# Patient Record
Sex: Female | Born: 1955 | Race: Black or African American | Hispanic: No | Smoking: Former smoker
Health system: Southern US, Community
[De-identification: ages and names within clinical notes are randomized; demographics above are authoritative.]

---

## 2015-06-22 ENCOUNTER — Inpatient Hospital Stay (HOSPITAL_COMMUNITY)
Admission: EM | Admit: 2015-06-22 | Discharge: 2015-06-24 | DRG: 392 | Disposition: A | Discharge status: Home / Self Care | Payer: Self-pay | Attending: Internal Medicine | Admitting: Internal Medicine

## 2015-06-22 ENCOUNTER — Emergency Department (HOSPITAL_COMMUNITY): Payer: Self-pay

## 2015-06-22 ENCOUNTER — Encounter (HOSPITAL_COMMUNITY): Payer: Self-pay | Admitting: Emergency Medicine

## 2015-06-22 DIAGNOSIS — Z87891 Personal history of nicotine dependence: Secondary | ICD-10-CM

## 2015-06-22 DIAGNOSIS — E876 Hypokalemia: Secondary | ICD-10-CM | POA: Diagnosis present

## 2015-06-22 DIAGNOSIS — Z809 Family history of malignant neoplasm, unspecified: Secondary | ICD-10-CM

## 2015-06-22 DIAGNOSIS — K5792 Diverticulitis of intestine, part unspecified, without perforation or abscess without bleeding: Secondary | ICD-10-CM | POA: Diagnosis present

## 2015-06-22 DIAGNOSIS — Z833 Family history of diabetes mellitus: Secondary | ICD-10-CM

## 2015-06-22 DIAGNOSIS — E669 Obesity, unspecified: Secondary | ICD-10-CM | POA: Diagnosis present

## 2015-06-22 DIAGNOSIS — Z6841 Body Mass Index (BMI) 40.0 and over, adult: Secondary | ICD-10-CM

## 2015-06-22 DIAGNOSIS — K5720 Diverticulitis of large intestine with perforation and abscess without bleeding: Principal | ICD-10-CM

## 2015-06-22 LAB — COMPREHENSIVE METABOLIC PANEL
ALK PHOS: 94 U/L (ref 38–126)
ALT: 22 U/L (ref 14–54)
ANION GAP: 8 (ref 5–15)
AST: 23 U/L (ref 15–41)
Albumin: 2.6 g/dL — ABNORMAL LOW (ref 3.5–5.0)
BILIRUBIN TOTAL: 0.4 mg/dL (ref 0.3–1.2)
BUN: 7 mg/dL (ref 6–20)
CALCIUM: 8.2 mg/dL — AB (ref 8.9–10.3)
CO2: 30 mmol/L (ref 22–32)
CREATININE: 0.82 mg/dL (ref 0.44–1.00)
Chloride: 101 mmol/L (ref 101–111)
GFR calc non Af Amer: >60 mL/min (ref >60)
Glucose, Bld: 144 mg/dL — ABNORMAL HIGH (ref 65–99)
Potassium: 3.3 mmol/L — ABNORMAL LOW (ref 3.5–5.1)
SODIUM: 139 mmol/L (ref 135–145)
TOTAL PROTEIN: 7.6 g/dL (ref 6.5–8.1)

## 2015-06-22 LAB — CBC WITH DIFFERENTIAL/PLATELET
BASOS ABS: 0.2 10*3/uL — AB (ref 0.0–0.1)
BLASTS: 0 %
Band Neutrophils: 0 %
Basophils Relative: 1 %
EOS PCT: 0 %
Eosinophils Absolute: 0 10*3/uL (ref 0.0–0.7)
HEMATOCRIT: 34.9 % — AB (ref 36.0–46.0)
HEMOGLOBIN: 11 g/dL — AB (ref 12.0–15.0)
Lymphocytes Relative: 16 %
Lymphs Abs: 3.1 10*3/uL (ref 0.7–4.0)
MCH: 26.7 pg (ref 26.0–34.0)
MCHC: 31.5 g/dL (ref 30.0–36.0)
MCV: 84.7 fL (ref 78.0–100.0)
METAMYELOCYTES PCT: 0 %
MYELOCYTES: 0 %
Monocytes Absolute: 1 10*3/uL (ref 0.1–1.0)
Monocytes Relative: 5 %
NEUTROS PCT: 78 %
NRBC: 0 /100{WBCs}
Neutro Abs: 14.9 10*3/uL — ABNORMAL HIGH (ref 1.7–7.7)
Other: 0 %
PROMYELOCYTES ABS: 0 %
Platelets: 458 10*3/uL — ABNORMAL HIGH (ref 150–400)
RBC: 4.12 MIL/uL (ref 3.87–5.11)
RDW: 14.7 % (ref 11.5–15.5)
WBC: 19.2 10*3/uL — AB (ref 4.0–10.5)

## 2015-06-22 LAB — URINALYSIS, ROUTINE W REFLEX MICROSCOPIC
Glucose, UA: NEGATIVE mg/dL
HGB URINE DIPSTICK: NEGATIVE
KETONES UR: NEGATIVE mg/dL
NITRITE: NEGATIVE
PH: 6.5 (ref 5.0–8.0)
SPECIFIC GRAVITY, URINE: 1.01 (ref 1.005–1.030)
UROBILINOGEN UA: 1 mg/dL (ref 0.0–1.0)

## 2015-06-22 LAB — URINE MICROSCOPIC-ADD ON

## 2015-06-22 MED ORDER — DEXTROSE-NACL 5-0.9 % IV SOLN
INTRAVENOUS | Status: DC
  Administered 2015-06-22 – 2015-06-23 (×2): via INTRAVENOUS

## 2015-06-22 MED ORDER — HYDROMORPHONE HCL 1 MG/ML IJ SOLN
1.0000 mg | INTRAMUSCULAR | Status: DC | PRN
  Administered 2015-06-23 (×2): 1 mg via INTRAVENOUS
  Filled 2015-06-22 (×2): qty 1

## 2015-06-22 MED ORDER — METRONIDAZOLE IN NACL 5-0.79 MG/ML-% IV SOLN
500.0000 mg | Freq: Once | INTRAVENOUS | Status: AC
  Administered 2015-06-22: 500 mg via INTRAVENOUS
  Filled 2015-06-22: qty 100

## 2015-06-22 MED ORDER — CIPROFLOXACIN IN D5W 400 MG/200ML IV SOLN
400.0000 mg | Freq: Two times a day (BID) | INTRAVENOUS | Status: DC
  Administered 2015-06-22 – 2015-06-23 (×3): 400 mg via INTRAVENOUS
  Filled 2015-06-22 (×3): qty 200

## 2015-06-22 MED ORDER — HEPARIN SODIUM (PORCINE) 5000 UNIT/ML IJ SOLN
5000.0000 [IU] | Freq: Three times a day (TID) | INTRAMUSCULAR | Status: DC
Start: 2015-06-22 — End: 2015-06-24
  Administered 2015-06-23 – 2015-06-24 (×3): 5000 [IU] via SUBCUTANEOUS
  Filled 2015-06-22 (×3): qty 1

## 2015-06-22 MED ORDER — ONDANSETRON HCL 4 MG PO TABS: 4.0000 mg | ORAL_TABLET | Freq: Four times a day (QID) | ORAL | Status: DC | PRN

## 2015-06-22 MED ORDER — METRONIDAZOLE IN NACL 5-0.79 MG/ML-% IV SOLN
500.0000 mg | Freq: Three times a day (TID) | INTRAVENOUS | Status: DC
Start: 2015-06-23 — End: 2015-06-24
  Administered 2015-06-23 – 2015-06-24 (×4): 500 mg via INTRAVENOUS
  Filled 2015-06-22 (×4): qty 100

## 2015-06-22 MED ORDER — CIPROFLOXACIN IN D5W 400 MG/200ML IV SOLN
400.0000 mg | Freq: Once | INTRAVENOUS | Status: AC
  Filled 2015-06-22: qty 200

## 2015-06-22 MED ORDER — ONDANSETRON HCL 4 MG/2ML IJ SOLN: 4.0000 mg | Freq: Four times a day (QID) | INTRAMUSCULAR | Status: DC | PRN

## 2015-06-22 MED ORDER — SODIUM CHLORIDE 0.9 % IV BOLUS (SEPSIS)
1000.0000 mL | Freq: Once | INTRAVENOUS | Status: AC
Start: 2015-06-22 — End: 2015-06-22
  Administered 2015-06-22: 1000 mL via INTRAVENOUS

## 2015-06-22 MED ORDER — IOHEXOL 300 MG/ML  SOLN
25.0000 mL | Freq: Once | INTRAMUSCULAR | Status: AC | PRN
  Administered 2015-06-22: 25 mL via ORAL

## 2015-06-22 MED ORDER — IOHEXOL 300 MG/ML  SOLN
100.0000 mL | Freq: Once | INTRAMUSCULAR | Status: AC | PRN
  Administered 2015-06-22: 100 mL via INTRAVENOUS

## 2015-06-22 MED ORDER — DOCUSATE SODIUM 100 MG PO CAPS
100.0000 mg | ORAL_CAPSULE | Freq: Two times a day (BID) | ORAL | Status: DC
Start: 2015-06-22 — End: 2015-06-24
  Administered 2015-06-23 – 2015-06-24 (×2): 100 mg via ORAL
  Filled 2015-06-22 (×3): qty 1

## 2015-06-22 NOTE — ED Provider Notes (Addendum)
CSN: [id number]     Arrival date & time 06/22/15  1819 History   This chart was scribed for Courteney Randall An, MD by Arlan Organ, ED Scribe. This patient was seen in room APA04/APA04 and the patient's care was started 8:29 PM.   Chief Complaint  Patient presents with  . Abdominal Pain   The history is provided by the patient. No language interpreter was used.     HPI Comments: Kelly Mayer is a 59 y.o. female without any pertinent past medical history who presents to the Emergency Department complaining of intermittent, ongoing RLQ abdominal pain x 3 weeks; worsened this evening. Pain is described as sharp. Discomfort is made worse with ambulation and with all movement. No alleviating factors at this time. No OTC medications or home remedies attempted prior to arrival. Denies any fever, chills, nausea, vomiting, or diarrhea. No reported urinary symptoms. No known allergies to medications.  Of note: Pt known to be febrile in triage this evening   History reviewed. No pertinent past medical history. History reviewed. No pertinent past surgical history. Family History  Problem Relation Age of Onset  . Diabetes Sister   . Cancer Brother    Social History  Substance Use Topics  . Smoking status: Former Smoker -- 0.50 packs/day for 15 years    Types: Cigarettes    Quit date: 10/04/2004  . Smokeless tobacco: Never Used  . Alcohol Use: No   OB History    Gravida Para Term Preterm AB TAB SAB Ectopic Multiple Living   Review of Systems  Constitutional: Negative for fever and chills.  Respiratory: Negative for shortness of breath.   Gastrointestinal: Positive for abdominal pain. Negative for nausea, vomiting and diarrhea.  Genitourinary: Negative for dysuria, urgency, frequency and hematuria.  Psychiatric/Behavioral: Negative for confusion.  All other systems reviewed and are negative.     Allergies  Review of patient's allergies indicates no known  allergies.  Home Medications   Prior to Admission medications   Not on File   Triage Vitals: BP 155/94 mmHg  Pulse 105  Temp(Src) 101.3 F (38.5 C) (Oral)  Resp 20  Ht  (1.676 m)  Wt 310 lb (140.615 kg)  BMI 50.06 kg/m2  SpO2 100%   Physical Exam  Constitutional: She is oriented to person, place, and time. She appears well-developed and well-nourished. No distress.  Morbidly obese african [place] female   HENT:  Head: Normocephalic and atraumatic.  Eyes: EOM are normal.  Neck: Normal range of motion.  Cardiovascular: Normal rate, regular rhythm and normal heart sounds.   Pulmonary/Chest: Effort normal and breath sounds normal. She has no wheezes.  Lungs clear  Abdominal: Soft. She exhibits no distension. There is tenderness.  Tenderness to palpation under pannus on the RLQ  Musculoskeletal: Normal range of motion.  Neurological: She is alert and oriented to person, place, and time.  Skin: Skin is warm and dry.  Psychiatric: She has a normal mood and affect. Judgment normal.  Nursing note and vitals reviewed.   ED Course  Procedures (including critical care time)  DIAGNOSTIC STUDIES: Oxygen Saturation is 100% on RA, Normal by my interpretation.    COORDINATION OF CARE: 8:34 PM- Will order CT abdomen pelvis with contrast, CBC, CMP, and urinalysis. Discussed treatment plan with pt at bedside and pt agreed to plan.     Labs Review Labs Reviewed  CBC WITH DIFFERENTIAL/PLATELET - Abnormal; Notable  for the following:    WBC 19.2 (*)    Hemoglobin 11.0 (*)    HCT 34.9 (*)    Platelets 458 (*)    Neutro Abs 14.9 (*)    Basophils Absolute 0.2 (*)    All other components within normal limits  COMPREHENSIVE METABOLIC PANEL - Abnormal; Notable for the following:    Potassium 3.3 (*)    Glucose, Bld 144 (*)    Calcium 8.2 (*)    Albumin 2.6 (*)    All other components within normal limits  URINALYSIS, ROUTINE W REFLEX MICROSCOPIC (NOT AT [facility]) - Abnormal; Notable  for the following:    APPearance HAZY (*)    Bilirubin Urine SMALL (*)    Protein, ur TRACE (*)    Leukocytes, UA TRACE (*)    All other components within normal limits  URINE MICROSCOPIC-ADD ON - Abnormal; Notable for the following:    Squamous Epithelial / LPF MANY (*)    Bacteria, UA MANY (*)    All other components within normal limits    Imaging Review Ct Abdomen Pelvis W Contrast  06/22/2015   CLINICAL DATA:  Constant right lower abdominal pain with generalize weakness for 3 weeks. Fever. Nausea. Decreased appetite. Vomiting bowel. Diarrhea.  EXAM: CT ABDOMEN AND PELVIS WITH CONTRAST  TECHNIQUE: Multidetector CT imaging of the abdomen and pelvis was performed using the standard protocol following bolus administration of intravenous contrast.  CONTRAST:  25mL OMNIPAQUE IOHEXOL 300 MG/ML SOLN, OMNIPAQUE IOHEXOL 300 MG/ML SOLN  COMPARISON:  None.  FINDINGS: Lung bases are clear.  Mild diffuse fatty infiltration of the liver. Cholelithiasis without inflammatory changes. No bile duct dilatation. Pancreas, spleen, adrenal glands, kidneys, abdominal aorta, inferior vena cava, and retroperitoneal lymph nodes are unremarkable. Stomach, small bowel, and colon are not abnormally distended. No free air or free fluid in the abdomen.  Pelvis: The appendix extends up to the anterior mid abdomen and is normal in appearance. The appendix is normal in caliber and is diffusely gas-filled. There is inflammatory reaction in the right pelvis with inflammatory stranding in the right pelvic fat and tiny free gas bubbles trapped in the fat. This appears to be related to the sigmoid colon and likely represents diverticulitis. Pelvic inflammatory disease less likely with the presence of gas. There is a loculated fluid collection in the anterior right pelvis measuring 3.6 cm diameter. I believe this to represent an ovarian cyst rather than abscess associated with the diverticulitis. Diverticulosis of the sigmoid and  descending colon. Uterus is not enlarged. Bladder wall is not thickened. Moderate-sized periumbilical hernia containing fat. Degenerative changes in the spine. No destructive bone lesions.  IMPRESSION: Inflammatory changes in the right pelvis likely representing diverticulitis with tiny gas bubbles consistent with localized micro perforation. No discrete abscess. Probable right renal cyst. Appendix is normal.  These results were called by telephone at the time of interpretation on 06/22/2015 at 9:39 pm to Dr. Bary Castilla , who verbally acknowledged these results.   Electronically Signed   By: Burman Nieves M.D.   On: 06/22/2015 21:46   I have personally reviewed and evaluated these images and lab results as part of my medical decision-making.   EKG Interpretation None      MDM   Final diagnoses:  None    Patient is a 59 year old female, African-American obese female presenting with right lower quadrant pain. This is been going on for 3 weeks off and on. She's had mild nausea. She had blood  drawn at triage and noted to have an white count as well as tachycardia. Location her pain is in the right lower quadrant. We will get an CAT scan to rule out appendicitis. Although this be atypical for appendicitis given her age and length of symptoms.  No other symptoms such as urinary or vaginal symptoms.  10:13 PM Patient CAT scan shows diverticulitis with microperfs. This is considered,complicated diverticulosis we'll admit to hospital. IV antibiotics ordered.    I personally performed the services described in this documentation, which was scribed in my presence. The recorded information has been reviewed and is accurate.   Courteney Randall An, MD 06/22/15 2213  Courteney Randall An, MD 06/22/15 2213

## 2015-06-22 NOTE — ED Notes (Signed)
Patient c/o right lower abd pain with generalized weakness x3 weeks. Unsure of any fevers. Fever noted in triage. Patient reports nausea, decrease of appetite, vomiting bile x1, and diarrhea. Per patient craving ice. Denies any blood in stool.

## 2015-06-22 NOTE — H&P (Signed)
[facility] History and Physical  Kelly Mayer ZOX:[id number] DOB: 1956/02/23    PCP:  No PCP.   Chief Complaint: abdominal pain for at least 3 weeks.   HPI: Kelly Mayer is an 59 y.o. female with benign past medical history on no medication, but clearly by virtue of hardly ever saw a physician, as her last visit to any physician was over 10 years ago, presented to the ER with LLQ pain for several weeks.  She has no fever, chills, nausea, vomiting, black or bloody stool.  She never had any colonoscopy before.  Work up included a leukocytosis with WBC of 19K, and an abdominal pelvic CT showed diverticulitis with "microperforation" and no abscess.  She was given IV Cipro and Flagyl, and hospitalist was asked to admit her for complicated diverticultis.   Rewiew of Systems:  Constitutional: Negative for malaise, fever and chills. No significant weight loss or weight gain Eyes: Negative for eye pain, redness and discharge, diplopia, visual changes, or flashes of light. ENMT: Negative for ear pain, hoarseness, nasal congestion, sinus pressure and sore throat. No headaches; tinnitus, drooling, or problem swallowing. Cardiovascular: Negative for chest pain, palpitations, diaphoresis, dyspnea and peripheral edema. ; No orthopnea, PND Respiratory: Negative for cough, hemoptysis, wheezing and stridor. No pleuritic chestpain. Gastrointestinal: Negative for nausea, vomiting, diarrhea, constipation, , melena, blood in stool, hematemesis, jaundice and rectal bleeding.    Genitourinary: Negative for frequency, dysuria, incontinence,flank pain and hematuria; Musculoskeletal: Negative for back pain and neck pain. Negative for swelling and trauma.;  Skin: . Negative for pruritus, rash, abrasions, bruising and skin lesion.; ulcerations Neuro: Negative for headache, lightheadedness and neck stiffness. Negative for weakness, altered level of consciousness , altered mental status, extremity weakness,  burning feet, involuntary movement, seizure and syncope.  Psych: negative for anxiety, depression, insomnia, tearfulness, panic attacks, hallucinations, paranoia, suicidal or homicidal ideation    History reviewed. No pertinent past medical history.  History reviewed. No pertinent past surgical history.  Medications:  HOME MEDS: On mo meds.         Allergies:  No Known Allergies  Social History:   reports that she quit smoking about 10 years ago. Her smoking use included Cigarettes. She has a 7.5 pack-year smoking history. She has never used smokeless tobacco. She reports that she does not drink alcohol or use illicit drugs.  Family History: Family History  Problem Relation Age of Onset  . Diabetes Sister   . Cancer Brother      Physical Exam: Filed Vitals:   06/22/15 1835 06/22/15 2230  BP: 155/94 145/66  Pulse: 105 94  Temp: 101.3 F (38.5 C) 99.5 F (37.5 C)  TempSrc: Oral Oral  Resp: 20 20  Height:  (1.676 m)   Weight: 140.615 kg (310 lb)   SpO2: 100% 100%   Blood pressure 145/66, pulse 94, temperature 99.5 F (37.5 C), temperature source Oral, resp. rate 20, height  (1.676 m), weight 140.615 kg (310 lb), SpO2 100 %.  GEN:  Pleasant  patient lying in the stretcher in no acute distress; cooperative with exam. She is obese.  PSYCH:  alert and oriented x4; does not appear anxious or depressed; affect is appropriate. HEENT: Mucous membranes pink and anicteric; PERRLA; EOM intact; no cervical lymphadenopathy nor thyromegaly or carotid bruit; no JVD; There were no stridor. Neck is very supple. Breasts:: Not examined CHEST WALL: No tenderness CHEST: Normal respiration, clear to auscultation bilaterally.  HEART: Regular rate and rhythm.  There are no murmur,  rub, or gallops.   BACK: No kyphosis or scoliosis; no CVA tenderness ABDOMEN: soft and non-tender; no masses, no organomegaly, normal abdominal bowel sounds; no pannus; no intertriginous candida. There  is left lower quadrant tenderness, but no rebound.  Rectal Exam: Not done EXTREMITIES: No bone or joint deformity; age-appropriate arthropathy of the hands and knees; no edema; no ulcerations.  There is no calf tenderness. Genitalia: not examined PULSES: 2+ and symmetric SKIN: Normal hydration no rash or ulceration CNS: Cranial nerves 2-12 grossly intact no focal lateralizing neurologic deficit.  Speech is fluent; uvula elevated with phonation, facial symmetry and tongue midline. DTR are normal bilaterally, cerebella exam is intact, barbinski is negative and strengths are equaled bilaterally.  No sensory loss.   Labs on Admission:  Basic Metabolic Panel:  Recent Labs Lab 06/22/15 1933  NA 139  K 3.3*  CL 101  CO2 30  GLUCOSE 144*  BUN 7  CREATININE 0.82  CALCIUM 8.2*   Liver Function Tests:  Recent Labs Lab 06/22/15 1933  AST 23  ALT 22  ALKPHOS 94  BILITOT 0.4  PROT 7.6  ALBUMIN 2.6*   CBC:  Recent Labs Lab 06/22/15 1933  WBC 19.2*  NEUTROABS 14.9*  HGB 11.0*  HCT 34.9*  MCV 84.7  PLT 458*   Radiological Exams on Admission: Ct Abdomen Pelvis W Contrast  06/22/2015   CLINICAL DATA:  Constant right lower abdominal pain with generalize weakness for 3 weeks. Fever. Nausea. Decreased appetite. Vomiting bowel. Diarrhea.  EXAM: CT ABDOMEN AND PELVIS WITH CONTRAST  TECHNIQUE: Multidetector CT imaging of the abdomen and pelvis was performed using the standard protocol following bolus administration of intravenous contrast.  CONTRAST:  25mL OMNIPAQUE IOHEXOL 300 MG/ML SOLN, OMNIPAQUE IOHEXOL 300 MG/ML SOLN  COMPARISON:  None.  FINDINGS: Lung bases are clear.  Mild diffuse fatty infiltration of the liver. Cholelithiasis without inflammatory changes. No bile duct dilatation. Pancreas, spleen, adrenal glands, kidneys, abdominal aorta, inferior vena cava, and retroperitoneal lymph nodes are unremarkable. Stomach, small bowel, and colon are not abnormally distended. No free  air or free fluid in the abdomen.  Pelvis: The appendix extends up to the anterior mid abdomen and is normal in appearance. The appendix is normal in caliber and is diffusely gas-filled. There is inflammatory reaction in the right pelvis with inflammatory stranding in the right pelvic fat and tiny free gas bubbles trapped in the fat. This appears to be related to the sigmoid colon and likely represents diverticulitis. Pelvic inflammatory disease less likely with the presence of gas. There is a loculated fluid collection in the anterior right pelvis measuring 3.6 cm diameter. I believe this to represent an ovarian cyst rather than abscess associated with the diverticulitis. Diverticulosis of the sigmoid and descending colon. Uterus is not enlarged. Bladder wall is not thickened. Moderate-sized periumbilical hernia containing fat. Degenerative changes in the spine. No destructive bone lesions.  IMPRESSION: Inflammatory changes in the right pelvis likely representing diverticulitis with tiny gas bubbles consistent with localized micro perforation. No discrete abscess. Probable right renal cyst. Appendix is normal.  These results were called by telephone at the time of interpretation on 06/22/2015 at 9:39 pm to Dr. Bary Castilla , who verbally acknowledged these results.   Electronically Signed   By: Burman Nieves M.D.   On: 06/22/2015 21:46    EKG: Independently reviewed.   Assessment/Plan  . Diverticulitis . Obesity  PLAN:  Will admit her for diverticulitis with microperforation.  Will continue with IV Cipro  and IV flagyl.  Will give IV pain medication, clear liquid, and IVF.  She will need a colonoscopy at some point, as colon cancer needs to be excluded.  I will consult surgery for any further recommendation.  She will need to find a PCP for follow up as well.  Thank you for allowing me to participate in her care.   Other plans as per orders.  Code Status: FULL Unk Lightning, MD. [facility] Pager [phone] 7pm to 7am.  06/22/2015, 11:05 PM

## 2015-06-23 ENCOUNTER — Encounter (HOSPITAL_COMMUNITY): Payer: Self-pay | Admitting: *Deleted

## 2015-06-23 DIAGNOSIS — K5720 Diverticulitis of large intestine with perforation and abscess without bleeding: Secondary | ICD-10-CM

## 2015-06-23 LAB — CBC
HCT: 30.9 % — ABNORMAL LOW (ref 36.0–46.0)
Hemoglobin: 9.5 g/dL — ABNORMAL LOW (ref 12.0–15.0)
MCH: 26.2 pg (ref 26.0–34.0)
MCHC: 30.7 g/dL (ref 30.0–36.0)
MCV: 85.1 fL (ref 78.0–100.0)
PLATELETS: 417 10*3/uL — AB (ref 150–400)
RBC: 3.63 MIL/uL — ABNORMAL LOW (ref 3.87–5.11)
RDW: 14.9 % (ref 11.5–15.5)
WBC: 14.3 10*3/uL — AB (ref 4.0–10.5)

## 2015-06-23 LAB — BASIC METABOLIC PANEL
Anion gap: 6 (ref 5–15)
BUN: 6 mg/dL (ref 6–20)
CHLORIDE: 103 mmol/L (ref 101–111)
CO2: 27 mmol/L (ref 22–32)
CREATININE: 0.76 mg/dL (ref 0.44–1.00)
Calcium: 7.5 mg/dL — ABNORMAL LOW (ref 8.9–10.3)
GFR calc Af Amer: >60 mL/min (ref >60)
Glucose, Bld: 198 mg/dL — ABNORMAL HIGH (ref 65–99)
Potassium: 3.1 mmol/L — ABNORMAL LOW (ref 3.5–5.1)
SODIUM: 136 mmol/L (ref 135–145)

## 2015-06-23 MED ORDER — HYDROCODONE-ACETAMINOPHEN 5-325 MG PO TABS
1.0000 | ORAL_TABLET | Freq: Four times a day (QID) | ORAL | Status: DC | PRN
  Administered 2015-06-23: 1 via ORAL
  Filled 2015-06-23: qty 1

## 2015-06-23 MED ORDER — POTASSIUM CHLORIDE 10 MEQ/100ML IV SOLN
10.0000 meq | INTRAVENOUS | Status: DC
  Administered 2015-06-23 (×3): 10 meq via INTRAVENOUS
  Filled 2015-06-23: qty 100

## 2015-06-23 MED ORDER — KCL IN DEXTROSE-NACL 40-5-0.45 MEQ/L-%-% IV SOLN
INTRAVENOUS | Status: DC
  Administered 2015-06-23: 11:00:00 via INTRAVENOUS

## 2015-06-23 NOTE — Consult Note (Signed)
Reason for Consult: Sigmoid diverticulitis Referring Physician: Hospitalist  Declyn Offield is an 59 y.o. female.  HPI: Patient is a 59 year old black female who states for the past 3 weeks she has been having lower abdominal pain. She presented the emergency patient room due to worsening pain. CT scan the abdomen and pelvis reveals diverticulitis with microperforation. She also has a large ovarian cyst. No free fluid is noted within the abdominal cavity. No significant pneumoperitoneum is noted. She states this is her first episode. She has not seen a doctor in over 10 years. She is having bowel movements.  History reviewed. No pertinent past medical history.  History reviewed. No pertinent past surgical history.  Family History  Problem Relation Age of Onset  . Diabetes Sister   . Cancer Brother     Social History:  reports that she quit smoking about 10 years ago. Her smoking use included Cigarettes. She has a 7.5 pack-year smoking history. She has never used smokeless tobacco. She reports that she does not drink alcohol or use illicit drugs.  Allergies: No Known Allergies  Medications: I have reviewed the patient's current medications.  Results for orders placed or performed during the hospital encounter of 06/22/15 (from the past 48 hour(s))  CBC with Differential     Status: Abnormal   Collection Time: 06/22/15  7:33 PM  Result Value Ref Range   WBC 19.2 (H) 4.0 - 10.5 K/uL   RBC 4.12 3.87 - 5.11 MIL/uL   Hemoglobin 11.0 (L) 12.0 - 15.0 g/dL   HCT 34.9 (L) 36.0 - 46.0 %   MCV 84.7 78.0 - 100.0 fL   MCH 26.7 26.0 - 34.0 pg   MCHC 31.5 30.0 - 36.0 g/dL   RDW 14.7 11.5 - 15.5 %   Platelets 458 (H) 150 - 400 K/uL   Neutrophils Relative % 78 %   Lymphocytes Relative 16 %   Monocytes Relative 5 %   Eosinophils Relative 0 %   Basophils Relative 1 %   Band Neutrophils 0 %   Metamyelocytes Relative 0 %   Myelocytes 0 %   Promyelocytes Absolute 0 %   Blasts 0 %   nRBC 0 0  /100 WBC   Other 0 %   Neutro Abs 14.9 (H) 1.7 - 7.7 K/uL   Lymphs Abs 3.1 0.7 - 4.0 K/uL   Monocytes Absolute 1.0 0.1 - 1.0 K/uL   Eosinophils Absolute 0.0 0.0 - 0.7 K/uL   Basophils Absolute 0.2 (H) 0.0 - 0.1 K/uL  Comprehensive metabolic panel     Status: Abnormal   Collection Time: 06/22/15  7:33 PM  Result Value Ref Range   Sodium 139 135 - 145 mmol/L   Potassium 3.3 (L) 3.5 - 5.1 mmol/L   Chloride 101 101 - 111 mmol/L   CO2 30 22 - 32 mmol/L   Glucose, Bld 144 (H) 65 - 99 mg/dL   BUN 7 6 - 20 mg/dL   Creatinine, Ser 0.82 0.44 - 1.00 mg/dL   Calcium 8.2 (L) 8.9 - 10.3 mg/dL   Total Protein 7.6 6.5 - 8.1 g/dL   Albumin 2.6 (L) 3.5 - 5.0 g/dL   AST 23 15 - 41 U/L   ALT 22 14 - 54 U/L   Alkaline Phosphatase 94 38 - 126 U/L   Total Bilirubin 0.4 0.3 - 1.2 mg/dL   GFR calc non Af Amer >60 >60 mL/min   GFR calc Af Amer >60 >60 mL/min    Comment: (NOTE) The  eGFR has been calculated using the CKD EPI equation. This calculation has not been validated in all clinical situations. eGFR's persistently <60 mL/min signify possible Chronic Kidney Disease.    Anion gap 8 5 - 15  Urinalysis, Routine w reflex microscopic (not at [facility])     Status: Abnormal   Collection Time: 06/22/15  7:50 PM  Result Value Ref Range   Color, Urine YELLOW YELLOW   APPearance HAZY (A) CLEAR   Specific Gravity, Urine 1.010 1.005 - 1.030   pH 6.5 5.0 - 8.0   Glucose, UA NEGATIVE NEGATIVE mg/dL   Hgb urine dipstick NEGATIVE NEGATIVE   Bilirubin Urine SMALL (A) NEGATIVE   Ketones, ur NEGATIVE NEGATIVE mg/dL   Protein, ur TRACE (A) NEGATIVE mg/dL   Urobilinogen, UA 1.0 0.0 - 1.0 mg/dL   Nitrite NEGATIVE NEGATIVE   Leukocytes, UA TRACE (A) NEGATIVE  Urine microscopic-add on     Status: Abnormal   Collection Time: 06/22/15  7:50 PM  Result Value Ref Range   Squamous Epithelial / LPF MANY (A) RARE   WBC, UA 3-6 <3 WBC/hpf   RBC / HPF 0-2 <3 RBC/hpf   Bacteria, UA MANY (A) RARE  Basic metabolic panel      Status: Abnormal   Collection Time: 06/23/15  7:43 AM  Result Value Ref Range   Sodium 136 135 - 145 mmol/L   Potassium 3.1 (L) 3.5 - 5.1 mmol/L   Chloride 103 101 - 111 mmol/L   CO2 27 22 - 32 mmol/L   Glucose, Bld 198 (H) 65 - 99 mg/dL   BUN 6 6 - 20 mg/dL   Creatinine, Ser 0.76 0.44 - 1.00 mg/dL   Calcium 7.5 (L) 8.9 - 10.3 mg/dL   GFR calc non Af Amer >60 >60 mL/min   GFR calc Af Amer >60 >60 mL/min    Comment: (NOTE) The eGFR has been calculated using the CKD EPI equation. This calculation has not been validated in all clinical situations. eGFR's persistently <60 mL/min signify possible Chronic Kidney Disease.    Anion gap 6 5 - 15  CBC     Status: Abnormal   Collection Time: 06/23/15  7:43 AM  Result Value Ref Range   WBC 14.3 (H) 4.0 - 10.5 K/uL   RBC 3.63 (L) 3.87 - 5.11 MIL/uL   Hemoglobin 9.5 (L) 12.0 - 15.0 g/dL   HCT 30.9 (L) 36.0 - 46.0 %   MCV 85.1 78.0 - 100.0 fL   MCH 26.2 26.0 - 34.0 pg   MCHC 30.7 30.0 - 36.0 g/dL   RDW 14.9 11.5 - 15.5 %   Platelets 417 (H) 150 - 400 K/uL    Ct Abdomen Pelvis W Contrast  06/22/2015   CLINICAL DATA:  Constant right lower abdominal pain with generalize weakness for 3 weeks. Fever. Nausea. Decreased appetite. Vomiting bowel. Diarrhea.  EXAM: CT ABDOMEN AND PELVIS WITH CONTRAST  TECHNIQUE: Multidetector CT imaging of the abdomen and pelvis was performed using the standard protocol following bolus administration of intravenous contrast.  CONTRAST:  4m OMNIPAQUE IOHEXOL 300 MG/ML SOLN, 1054mOMNIPAQUE IOHEXOL 300 MG/ML SOLN  COMPARISON:  None.  FINDINGS: Lung bases are clear.  Mild diffuse fatty infiltration of the liver. Cholelithiasis without inflammatory changes. No bile duct dilatation. Pancreas, spleen, adrenal glands, kidneys, abdominal aorta, inferior vena cava, and retroperitoneal lymph nodes are unremarkable. Stomach, small bowel, and colon are not abnormally distended. No free air or free fluid in the abdomen.  Pelvis: The  appendix  extends up to the anterior mid abdomen and is normal in appearance. The appendix is normal in caliber and is diffusely gas-filled. There is inflammatory reaction in the right pelvis with inflammatory stranding in the right pelvic fat and tiny free gas bubbles trapped in the fat. This appears to be related to the sigmoid colon and likely represents diverticulitis. Pelvic inflammatory disease less likely with the presence of gas. There is a loculated fluid collection in the anterior right pelvis measuring 3.6 cm diameter. I believe this to represent an ovarian cyst rather than abscess associated with the diverticulitis. Diverticulosis of the sigmoid and descending colon. Uterus is not enlarged. Bladder wall is not thickened. Moderate-sized periumbilical hernia containing fat. Degenerative changes in the spine. No destructive bone lesions.  IMPRESSION: Inflammatory changes in the right pelvis likely representing diverticulitis with tiny gas bubbles consistent with localized micro perforation. No discrete abscess. Probable right renal cyst. Appendix is normal.  These results were called by telephone at the time of interpretation on 06/22/2015 at 9:39 pm to Dr. Zenovia Jarred , who verbally acknowledged these results.   Electronically Signed   By: Lucienne Capers M.D.   On: 06/22/2015 21:46    ROS: See chart Blood pressure 139/72, pulse 93, temperature 98.4 F (36.9 C), temperature source Oral, resp. rate 18, height 5' 6"  (1.676 m), weight 140.615 kg (310 lb), SpO2 95 %. Physical Exam: Pleasant black female in no acute distress. Abdomen is soft with minimal tenderness in the suprapubic region. No rigidity is noted. No hepatosplenomegaly is noted. Examination is limited secondary to body habitus.  Assessment/Plan: Impression: Sigmoid diverticulitis with microperforation. No pelvic abscess is noted. No need for acute surgical intervention at this time. Plan: We'll advance to full liquid diet. Would  continue IV antibiotics as ordered. Patient will need outpatient follow-up colonoscopy in 6-8 weeks. I also told the patient she strongly is encouraged to find a primary care physician and also to follow-up with a gynecologist. Burnis Medin follow with you.  JENKINS,MARK A 06/23/2015, 10:59 AM

## 2015-06-23 NOTE — Plan of Care (Signed)
RN confirmed with Dr. To administer all 3 potassium runs prior to DC orders.

## 2015-06-23 NOTE — Care Management Note (Signed)
Case Management Note  Patient Details  Name: Kelly Mayer MRN: [id number] Date of Birth: 1956-09-17  Subjective/Objective:                  Pt admitted from home with diverticulitis. Pt lives alone and will return home at discharge. Pt is independent with ADL's.  Action/Plan: Will arrange pt appt at [facility] prior to discharge. May need MATCH for discharge medications. Will continue to follow.  Expected Discharge Date:                  Expected Discharge Plan:  Home/Self Care  In-House Referral:  Financial Counselor  Discharge planning Services  MATCH Program, Follow-up appt scheduled  Post Acute Care Choice:  NA Choice offered to:  NA  DME Arranged:    DME Agency:     HH Arranged:    HH Agency:     Status of Service:  In process, will continue to follow  Medicare Important Message Given:    Date Medicare IM Given:    Medicare IM give by:    Date Additional Medicare IM Given:    Additional Medicare Important Message give by:     If discussed at Long Length of Stay Meetings, dates discussed:    Additional Comments:  Cheryl Flash, RN 06/23/2015, 1:19 PM

## 2015-06-23 NOTE — Progress Notes (Signed)
PROGRESS NOTE  Kelly Mayer ZOX:[id number] DOB: 09-11-1956 DOA: 06/22/2015 PCP: No primary care provider on file.  Summary:  59 y.o. female presented with LLQ pain for several weeks. While in the ED, labs revealed a leukocytosis of 19.2. Abdominal CT revealed diverticulitis with microperforation and no abscess. Admitted for further management of complicated diverticulitis.   Assessment/Plan: 1. Acute diverticulitis with microperforation. Abdominal CT revealed diverticulitis with tiny gas bubbles consistent with localized micro perforation. No discrete abscess. WBC trending down. Afebrile. Tolerating liquids. Per surgery recommends conservative management 2. Hypokalemia. Replete and monitor.   Improving clinically, tolerating liquids  contine present diet and abx  May advance in AM and possibly discharge home   Colonoscopy in 6-8 weekcolonoscopy in 6-8 week  Code Status: FULL DVT prophylaxis: Heparin Family Communication:  Disposition Plan: home  Brendia Sacks, MD  [facility]  Pager [phone] If 7PM-7AM, please contact night-coverage at [email or web address], password [facility] 06/23/2015, 7:06 AM  LOS: 1 day   Consultants:  General surgery  Procedures:    Antibiotics:  Cipro 9/18 >>  Flagyl 9/19>>  HPI/Subjective: Feeling better, less RLQ abd pain. Tolerating liquids.   Objective: Filed Vitals:   06/22/15 1835 06/22/15 2230 06/22/15 2300 06/23/15 0509  BP: 155/94 145/66 150/59 139/72  Pulse: 105 94 97 93  Temp: 101.3 F (38.5 C) 99.5 F (37.5 C) 99.8 F (37.7 C) 98.4 F (36.9 C)  TempSrc: Oral Oral Axillary Oral  Resp: Height:  (1.676 m)   (1.676 m)   Weight: 140.615 kg (310 lb)     SpO2: 100% 100% 99% 95%    Intake/Output Summary (Last 24 hours) at 06/23/15 0706 Last data filed at 06/22/15 2239  Gross per 24 hour  Intake   1000 ml  Output      0 ml  Net   1000 ml     Filed Weights   06/22/15 1835  Weight: 140.615 kg  (310 lb)    Exam:   VSS, not hypoxic, afebrile General:  Appears calm and comfortable Cardiovascular: RRR, no m/r/g. No LE edema. Respiratory: CTA bilaterally, no w/r/r. Normal respiratory effort. Abdomen: soft, ntnd, no RLQ pain Psychiatric: grossly normal mood and affect, speech fluent and appropriate   New data reviewed:  Potassium 3.1  WBC down to 14.3  Hbg down to 9.5  Platelets 417  Pertinent data since admission:  Abdominal CT IMPRESSION: Inflammatory changes in the right pelvis likely representing diverticulitis with tiny gas bubbles consistent with localized micro perforation. No discrete abscess. Probable right renal cyst. Appendix is normal.  Pending data:    Scheduled Meds: . ciprofloxacin  400 mg Intravenous Q12H  . docusate sodium  100 mg Oral BID  . heparin  5,000 Units Subcutaneous 3 times per day  . metronidazole  500 mg Intravenous Q8H   Continuous Infusions: . dextrose 5 % and 0.9% NaCl 125 mL/hr at 06/22/15 2315    Principal Problem:   Diverticulitis of colon with perforation Active Problems:   Obesity   Time spent 15 minutes  By signing my name below, I, Burnett Harry attest that this documentation has been prepared under the direction and in the presence of Brendia Sacks, MD Electronically signed: Burnett Harry, Scribe.  06/23/2015  I personally performed the services described in this documentation. All medical record entries made by the scribe were at my direction. I have reviewed the chart and agree that the record reflects my personal performance and is accurate and complete.  Brendia Sacks, MD

## 2015-06-24 DIAGNOSIS — E669 Obesity, unspecified: Secondary | ICD-10-CM

## 2015-06-24 DIAGNOSIS — K5720 Diverticulitis of large intestine with perforation and abscess without bleeding: Principal | ICD-10-CM

## 2015-06-24 LAB — CBC
HCT: 30.7 % — ABNORMAL LOW (ref 36.0–46.0)
Hemoglobin: 9.4 g/dL — ABNORMAL LOW (ref 12.0–15.0)
MCH: 26 pg (ref 26.0–34.0)
MCHC: 30.6 g/dL (ref 30.0–36.0)
MCV: 84.8 fL (ref 78.0–100.0)
PLATELETS: 368 10*3/uL (ref 150–400)
RBC: 3.62 MIL/uL — AB (ref 3.87–5.11)
RDW: 14.9 % (ref 11.5–15.5)
WBC: 11.5 10*3/uL — AB (ref 4.0–10.5)

## 2015-06-24 LAB — BASIC METABOLIC PANEL
ANION GAP: 5 (ref 5–15)
CALCIUM: 7.7 mg/dL — AB (ref 8.9–10.3)
CO2: 27 mmol/L (ref 22–32)
Chloride: 107 mmol/L (ref 101–111)
Creatinine, Ser: 0.76 mg/dL (ref 0.44–1.00)
GFR calc Af Amer: >60 mL/min (ref >60)
Glucose, Bld: 144 mg/dL — ABNORMAL HIGH (ref 65–99)
POTASSIUM: 3.6 mmol/L (ref 3.5–5.1)
SODIUM: 139 mmol/L (ref 135–145)

## 2015-06-24 MED ORDER — CIPROFLOXACIN HCL 250 MG PO TABS
500.0000 mg | ORAL_TABLET | Freq: Two times a day (BID) | ORAL | Status: DC
Start: 2015-06-24 — End: 2015-06-24
  Administered 2015-06-24: 500 mg via ORAL
  Filled 2015-06-24: qty 2

## 2015-06-24 MED ORDER — METRONIDAZOLE 500 MG PO TABS: 500.0000 mg | ORAL_TABLET | Freq: Three times a day (TID) | ORAL | Status: DC

## 2015-06-24 MED ORDER — METRONIDAZOLE 500 MG PO TABS
500.0000 mg | ORAL_TABLET | Freq: Once | ORAL | Status: AC
  Administered 2015-06-24: 500 mg via ORAL
  Filled 2015-06-24: qty 1

## 2015-06-24 MED ORDER — CIPROFLOXACIN HCL 500 MG PO TABS: 500.0000 mg | ORAL_TABLET | Freq: Two times a day (BID) | ORAL | Status: DC

## 2015-06-24 NOTE — Progress Notes (Signed)
Discharge discussed with the patient and all questioned fully answered. Discharge teaching included how to take prescribed antibiotics, when to call the MD, and follow up appointments. Prescriptions called into the [facility] in [place]. Follow up appointment scheduled.  She will call me if any problems arise.  Leonidas Romberg, RN

## 2015-06-24 NOTE — Care Management Note (Signed)
Case Management Note  Patient Details  Name: Kelly Mayer MRN: [id number] Date of Birth: 1956-06-04  Subjective/Objective:                   Action/Plan:   Expected Discharge Date:                  Expected Discharge Plan:  Home/Self Care  In-House Referral:  Financial Counselor  Discharge planning Services  MATCH Program, Follow-up appt scheduled  Post Acute Care Choice:  NA Choice offered to:  NA  DME Arranged:    DME Agency:     HH Arranged:    HH Agency:     Status of Service:  Completed, signed off  Medicare Important Message Given:    Date Medicare IM Given:    Medicare IM give by:    Date Additional Medicare IM Given:    Additional Medicare Important Message give by:     If discussed at Long Length of Stay Meetings, dates discussed:    Additional Comments: Pt discharged home today. Follow up appt made for pt and pt is aware. No CM needs noted. Arlyss Queen Whiting, RN 06/24/2015, 2:22 PM

## 2015-06-24 NOTE — Progress Notes (Signed)
Patient's abdominal pain has significantly decreased. White blood cell count is normalizing. Patient would like to go home. I would continue treating her with ciprofloxacin and Flagyl or Augmentin for 2 weeks. I will see her in follow-up in 2 weeks.

## 2015-06-24 NOTE — Discharge Summary (Signed)
Discharge Summary  Kelly Mayer ZOX:[id number] DOB: 17-Dec-1955  PCP: No primary care provider on file.  Admit date: 06/22/2015 Discharge date: 06/24/2015  Time spent: <27mins  Recommendations for Outpatient Follow-up:  1. Establish care at [facility] for primary care, appointment on 9/26 at 11:30am, pmd to arrange colonoscopy in 6weeks. 2. F/u with general surgery Dr. Franky Macho    Discharge Diagnoses:  Active Hospital Problems   Diagnosis Date Noted  . Diverticulitis of colon with perforation 06/23/2015  . Obesity 06/22/2015    Resolved Hospital Problems   Diagnosis Date Noted Date Resolved  . Diverticulitis 06/22/2015 06/23/2015    Discharge Condition: stable  Diet recommendation: heart healthy/carb modified  Filed Weights   06/22/15 1835  Weight: 310 lb (140.615 kg)    History of present illness:  Kelly Mayer is an 59 y.o. female with benign past medical history on no medication, but clearly by virtue of hardly ever saw a physician, as her last visit to any physician was over 10 years ago, presented to the ER with LLQ pain for several weeks. She has no fever, chills, nausea, vomiting, black or bloody stool. She never had any colonoscopy before. Work up included a leukocytosis with WBC of 19K, and an abdominal pelvic CT showed diverticulitis with "microperforation" and no abscess. She was given IV Cipro and Flagyl, and hospitalist was asked to admit her for complicated diverticultis.    Hospital Course:  Principal Problem:   Diverticulitis of colon with perforation Active Problems:   Obesity  Diverticulitis with microperforation without abscess: received iv cipro and flagyl , symptom has resolved, no pain ,no fever, leukocytosis trending down, tolerating diet, cleared by general surgery to be discharged home with oral abx for two weeks.  Obesity: possible borderline diabetes, with positive family history of diabetes, diet control, life style  modification, pmd follow up.  Procedures:  none  Consultations:  General surgery, Dr. Franky Macho  Discharge Exam: BP 133/50 mmHg  Pulse 82  Temp(Src) 98.7 F (37.1 C) (Oral)  Resp 18  Ht  (1.676 m)  Wt 310 lb (140.615 kg)  BMI 50.06 kg/m2  SpO2 99%  General: aaox3, obese Cardiovascular: rrr Respiratory: CTABL Ab: RLQ tenderness has resolved.  Discharge Instructions You were cared for by a hospitalist during your hospital stay. If you have any questions about your discharge medications or the care you received while you were in the hospital after you are discharged, you can call the unit and asked to speak with the hospitalist on call if the hospitalist that took care of you is not available. Once you are discharged, your primary care physician will handle any further medical issues. Please note that NO REFILLS for any discharge medications will be authorized once you are discharged, as it is imperative that you return to your primary care physician (or establish a relationship with a primary care physician if you do not have one) for your aftercare needs so that they can reassess your need for medications and monitor your lab values.      Discharge Instructions    Diet - low sodium heart healthy    Complete by:  As directed   Carb modified diet     Increase activity slowly    Complete by:  As directed             Medication List    TAKE these medications        ciprofloxacin 500 MG tablet  Commonly known as:  CIPRO  Take 1 tablet (500 mg total) by mouth 2 (two) times daily.     metroNIDAZOLE 500 MG tablet  Commonly known as:  FLAGYL  Take 1 tablet (500 mg total) by mouth 3 (three) times daily.       No Known Allergies Follow-up Information    Follow up with Rondel Baton On 06/30/2015.   Why:  at 11:30   Contact information:   [address] [place] [address] [phone]       Follow up with Dalia Heading, MD On 07/08/2015.    Specialty:  General Surgery   Why:  at 9:45 am   Contact information:   [address] [place] [address] [phone]        The results of significant diagnostics from this hospitalization (including imaging, microbiology, ancillary and laboratory) are listed below for reference.    Significant Diagnostic Studies: Ct Abdomen Pelvis W Contrast  06/22/2015   CLINICAL DATA:  Constant right lower abdominal pain with generalize weakness for 3 weeks. Fever. Nausea. Decreased appetite. Vomiting bowel. Diarrhea.  EXAM: CT ABDOMEN AND PELVIS WITH CONTRAST  TECHNIQUE: Multidetector CT imaging of the abdomen and pelvis was performed using the standard protocol following bolus administration of intravenous contrast.  CONTRAST:  25mL OMNIPAQUE IOHEXOL 300 MG/ML SOLN, OMNIPAQUE IOHEXOL 300 MG/ML SOLN  COMPARISON:  None.  FINDINGS: Lung bases are clear.  Mild diffuse fatty infiltration of the liver. Cholelithiasis without inflammatory changes. No bile duct dilatation. Pancreas, spleen, adrenal glands, kidneys, abdominal aorta, inferior vena cava, and retroperitoneal lymph nodes are unremarkable. Stomach, small bowel, and colon are not abnormally distended. No free air or free fluid in the abdomen.  Pelvis: The appendix extends up to the anterior mid abdomen and is normal in appearance. The appendix is normal in caliber and is diffusely gas-filled. There is inflammatory reaction in the right pelvis with inflammatory stranding in the right pelvic fat and tiny free gas bubbles trapped in the fat. This appears to be related to the sigmoid colon and likely represents diverticulitis. Pelvic inflammatory disease less likely with the presence of gas. There is a loculated fluid collection in the anterior right pelvis measuring 3.6 cm diameter. I believe this to represent an ovarian cyst rather than abscess associated with the diverticulitis. Diverticulosis of the sigmoid and descending colon. Uterus is not  enlarged. Bladder wall is not thickened. Moderate-sized periumbilical hernia containing fat. Degenerative changes in the spine. No destructive bone lesions.  IMPRESSION: Inflammatory changes in the right pelvis likely representing diverticulitis with tiny gas bubbles consistent with localized micro perforation. No discrete abscess. Probable right renal cyst. Appendix is normal.  These results were called by telephone at the time of interpretation on 06/22/2015 at 9:39 pm to Dr. Bary Castilla , who verbally acknowledged these results.   Electronically Signed   By: Burman Nieves M.D.   On: 06/22/2015 21:46    Microbiology: No results found for this or any previous visit (from the past 240 hour(s)).   Labs: Basic Metabolic Panel:  Recent Labs Lab 06/22/15 1933 06/23/15 0743 06/24/15 0555  NA 139 136 139  K 3.3* 3.1* 3.6  CL 101 103 107  CO2 GLUCOSE 144* 198* 144*  BUN 7 6 <5*  CREATININE 0.82 0.76 0.76  CALCIUM 8.2* 7.5* 7.7*   Liver Function Tests:  Recent Labs Lab 06/22/15 1933  AST 23  ALT 22  ALKPHOS 94  BILITOT 0.4  PROT 7.6  ALBUMIN 2.6*   No results for input(s): LIPASE, AMYLASE in the last 168 hours. No results for input(s): AMMONIA in the last 168 hours. CBC:  Recent Labs Lab 06/22/15 1933 06/23/15 0743 06/24/15 0555  WBC 19.2* 14.3* 11.5*  NEUTROABS 14.9*  --   --   HGB 11.0* 9.5* 9.4*  HCT 34.9* 30.9* 30.7*  MCV 84.7 85.1 84.8  PLT 458* 417* 368   Cardiac Enzymes: No results for input(s): CKTOTAL, CKMB, CKMBINDEX, TROPONINI in the last 168 hours. BNP: BNP (last 3 results) No results for input(s): BNP in the last 8760 hours.  ProBNP (last 3 results) No results for input(s): PROBNP in the last 8760 hours.  CBG: No results for input(s): GLUCAP in the last 168 hours.     SignedAlbertine Grates MD, PhD  [facility] 06/24/2015, 2:13 PM

## 2016-06-22 IMAGING — CT CT ABD-PELV W/ CM
2 of 5 series · 16 of 46 positions shown, 18 images · IV contrast (omnipaque)
Comparison: None.

CLINICAL DATA: Constant right lower abdominal pain with generalize
weakness for 3 weeks. Fever. Nausea. Decreased appetite. Vomiting
bowel. Diarrhea.

EXAM:
CT ABDOMEN AND PELVIS WITH CONTRAST
TECHNIQUE: Multidetector CT imaging of the abdomen and pelvis was performed
using the standard protocol following bolus administration of
intravenous contrast.
CONTRAST:  25mL OMNIPAQUE IOHEXOL 300 MG/ML SOLN, 100mL OMNIPAQUE
IOHEXOL 300 MG/ML SOLN

[Series 2: abd_pel_with 5.0 b40f · axial · 0.67mm/px · z∈[-532,-122]mm · 13 of 94 slices shown, 15 images]
[im 6/94  soft-tissue]
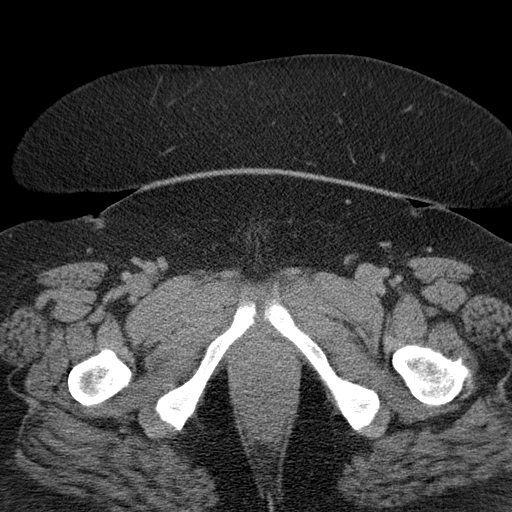
[im 6/94  bone]
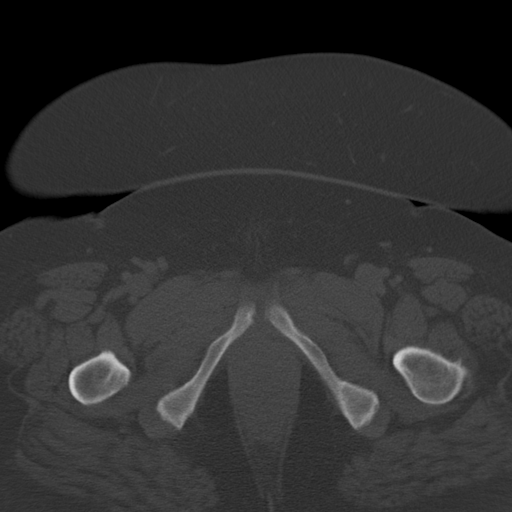
[im 12/94  soft-tissue]
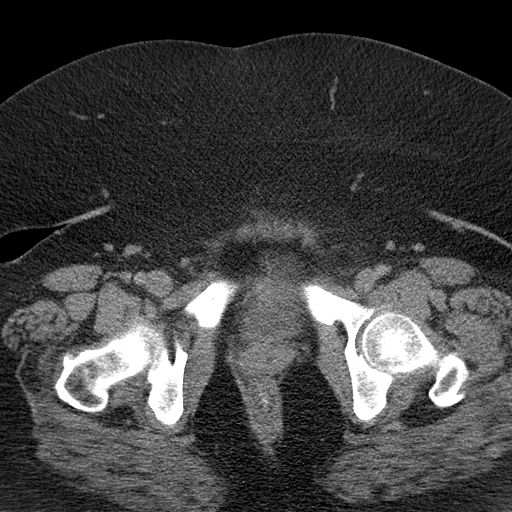
[im 18/94  soft-tissue]
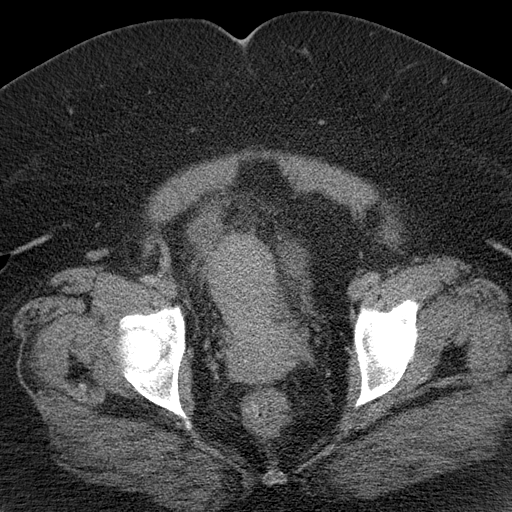
[im 30/94  soft-tissue]
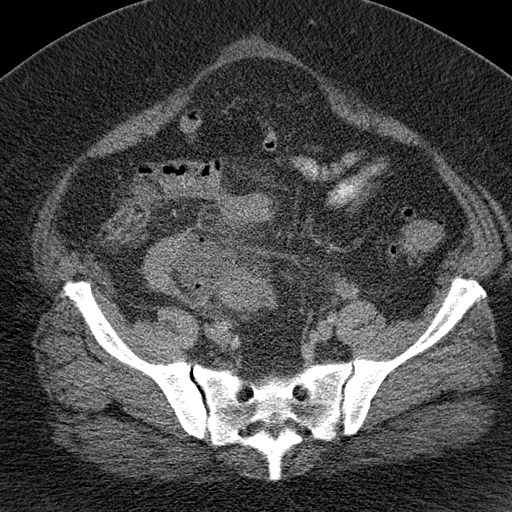
[im 35/94  soft-tissue]
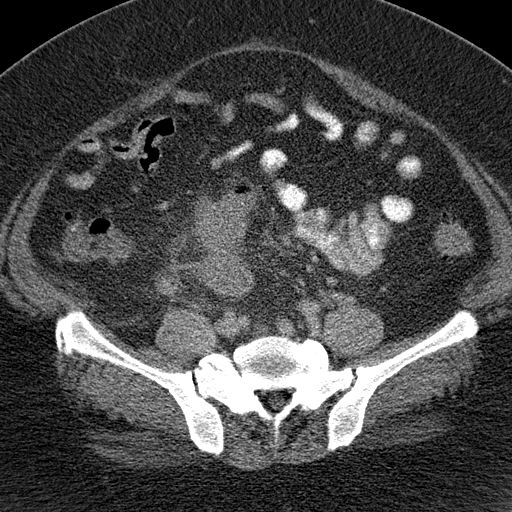
[im 41/94  soft-tissue]
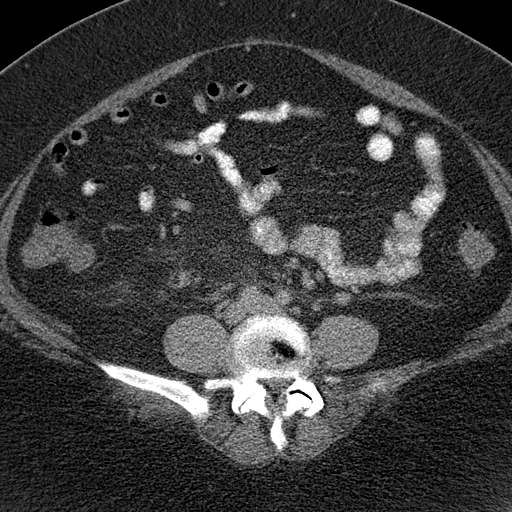
[im 47/94  soft-tissue]
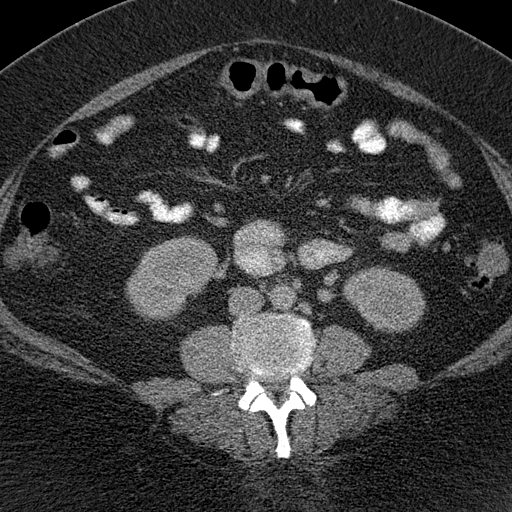
[im 53/94  soft-tissue]
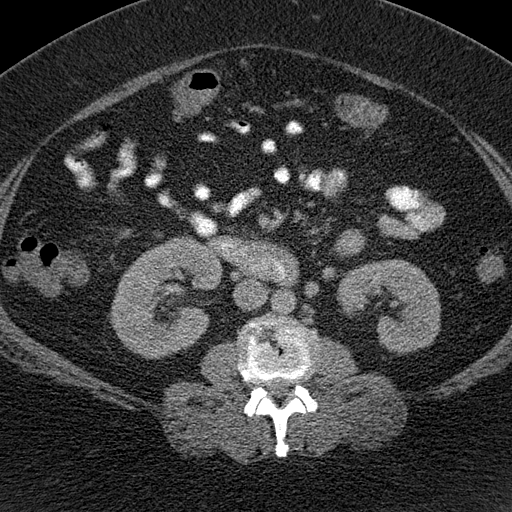
[im 59/94  soft-tissue]
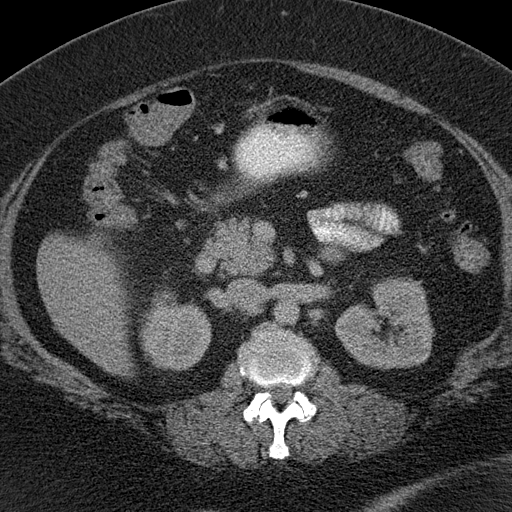
[im 59/94  bone]
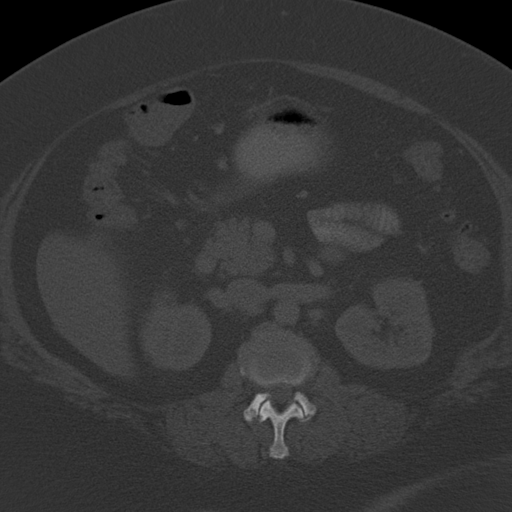
[im 64/94  soft-tissue]
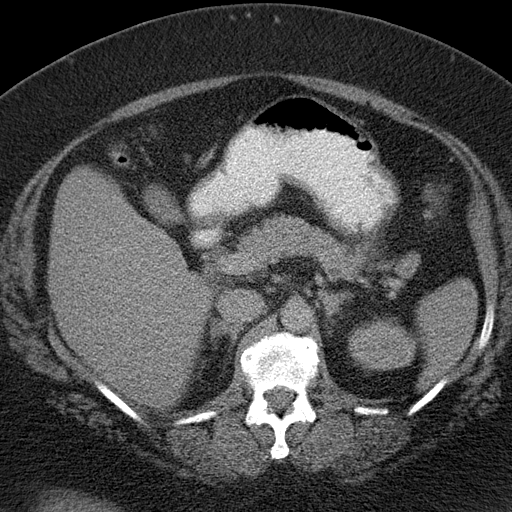
[im 76/94  soft-tissue]
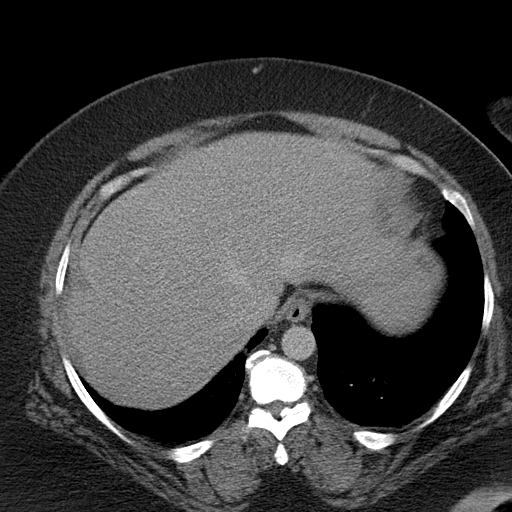
[im 82/94  soft-tissue]
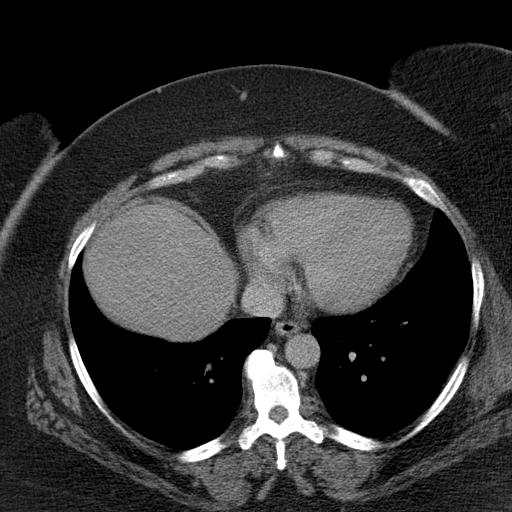
[im 88/94  soft-tissue]
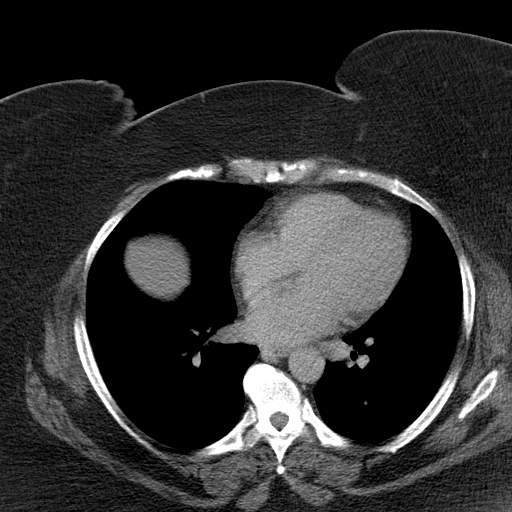

[Series 3: abd_pel_with 3.0 spo cor · coronal · 0.78mm/px · 3 of 129 slices shown]
[im 43/129  soft-tissue]
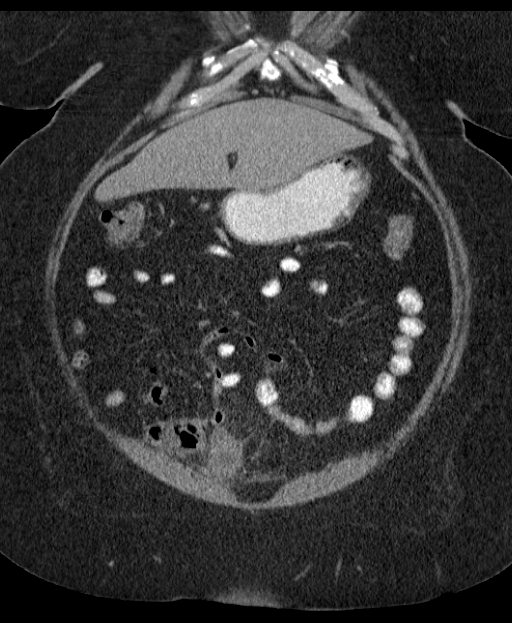
[im 57/129  soft-tissue]
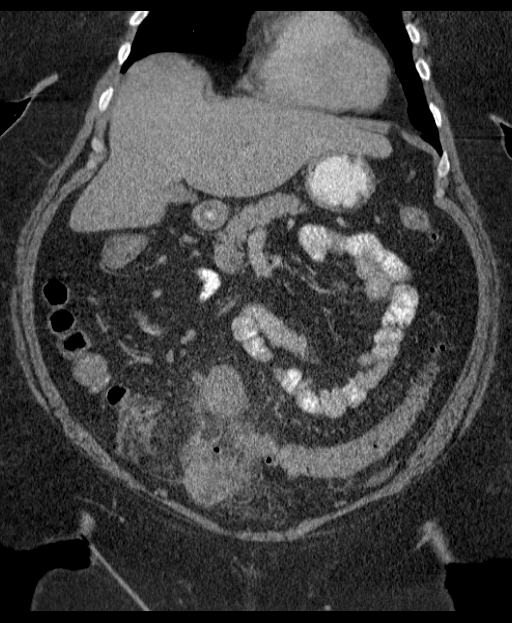
[im 72/129  soft-tissue]
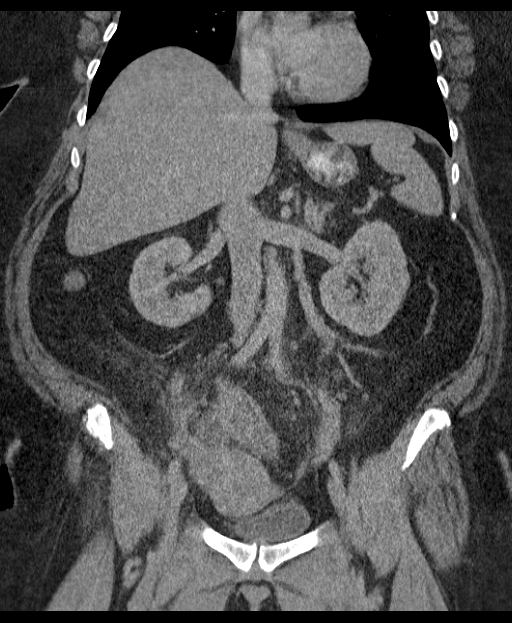

[16 of 46 positions shown; findings below may reference images not displayed]

FINDINGS: Lung bases are clear.

Mild diffuse fatty infiltration of the liver. Cholelithiasis without
inflammatory changes. No bile duct dilatation. Pancreas, spleen,
adrenal glands, kidneys, abdominal aorta, inferior vena cava, and
retroperitoneal lymph nodes are unremarkable. Stomach, small bowel,
and colon are not abnormally distended. No free air or free fluid in
the abdomen.

Pelvis: The appendix extends up to the anterior mid abdomen and is
normal in appearance. The appendix is normal in caliber and is
diffusely gas-filled. There is inflammatory reaction in the right
pelvis with inflammatory stranding in the right pelvic fat and tiny
free gas bubbles trapped in the fat. This appears to be related to
the sigmoid colon and likely represents diverticulitis. Pelvic
inflammatory disease less likely with the presence of gas. There is
a loculated fluid collection in the anterior right pelvis measuring
3.6 cm diameter. I believe this to represent an ovarian cyst rather
than abscess associated with the diverticulitis. Diverticulosis of
the sigmoid and descending colon. Uterus is not enlarged. Bladder
wall is not thickened. Moderate-sized periumbilical hernia
containing fat. Degenerative changes in the spine. No destructive
bone lesions.
IMPRESSION: Inflammatory changes in the right pelvis likely representing
diverticulitis with tiny gas bubbles consistent with localized micro
perforation. No discrete abscess. Probable right renal cyst.
Appendix is normal.

These results were called by telephone at the time of interpretation
on 06/22/2015 at [DATE] to Dr. PAULUS N CEEJAY , who verbally
acknowledged these results.

## 2016-10-14 ENCOUNTER — Emergency Department (HOSPITAL_COMMUNITY): Payer: Self-pay

## 2016-10-14 ENCOUNTER — Encounter (HOSPITAL_COMMUNITY): Payer: Self-pay | Admitting: *Deleted

## 2016-10-14 ENCOUNTER — Emergency Department (HOSPITAL_COMMUNITY)
Admission: EM | Admit: 2016-10-14 | Discharge: 2016-10-14 | Disposition: A | Discharge status: Home / Self Care | Payer: Self-pay | Attending: Emergency Medicine | Admitting: Emergency Medicine

## 2016-10-14 DIAGNOSIS — M19071 Primary osteoarthritis, right ankle and foot: Secondary | ICD-10-CM | POA: Insufficient documentation

## 2016-10-14 DIAGNOSIS — Z87891 Personal history of nicotine dependence: Secondary | ICD-10-CM | POA: Insufficient documentation

## 2016-10-14 MED ORDER — DEXAMETHASONE SODIUM PHOSPHATE 4 MG/ML IJ SOLN
8.0000 mg | Freq: Once | INTRAMUSCULAR | Status: AC
  Administered 2016-10-14: 8 mg via INTRAMUSCULAR
  Filled 2016-10-14: qty 2

## 2016-10-14 MED ORDER — TRAMADOL HCL 50 MG PO TABS
100.0000 mg | ORAL_TABLET | Freq: Once | ORAL | Status: AC
  Administered 2016-10-14: 100 mg via ORAL
  Filled 2016-10-14: qty 2

## 2016-10-14 MED ORDER — PROMETHAZINE HCL 12.5 MG PO TABS
12.5000 mg | ORAL_TABLET | Freq: Once | ORAL | Status: AC
Start: 2016-10-14 — End: 2016-10-14
  Administered 2016-10-14: 12.5 mg via ORAL
  Filled 2016-10-14: qty 1

## 2016-10-14 MED ORDER — DICLOFENAC SODIUM 75 MG PO TBEC: 75.0000 mg | DELAYED_RELEASE_TABLET | Freq: Two times a day (BID) | ORAL | 0 refills | Status: AC

## 2016-10-14 MED ORDER — TRAMADOL HCL 50 MG PO TABS: 50.0000 mg | ORAL_TABLET | Freq: Four times a day (QID) | ORAL | 0 refills | Status: AC | PRN

## 2016-10-14 MED ORDER — KETOROLAC TROMETHAMINE 10 MG PO TABS
10.0000 mg | ORAL_TABLET | Freq: Once | ORAL | Status: AC
  Administered 2016-10-14: 10 mg via ORAL
  Filled 2016-10-14: qty 1

## 2016-10-14 MED ORDER — DEXAMETHASONE 4 MG PO TABS: 4.0000 mg | ORAL_TABLET | Freq: Two times a day (BID) | ORAL | 0 refills | Status: AC

## 2016-10-14 NOTE — ED Provider Notes (Signed)
[facility] DEPT Provider Note   CSN: [id number] Arrival date & time: 10/14/16  1110     History   Chief Complaint Chief Complaint  Patient presents with  . Foot Pain    HPI Kelly Mayer is a 61 y.o. female.  Patient is a 61 year old female who presents to the emergency department with a complaint of right foot pain.  Patient states she's been having problems with her feet for about 2 weeks. Week ago a family member stepped on her foot with a very large shoe, and she states she's been noticing more swelling since that time. It is of note that she does a lot of standing. It is also of note that she suffers from obesity. The patient denies any recent operations or procedures involving the right foot or ankle. 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   The history is provided by the patient.  Foot Pain  Pertinent negatives include no chest pain, no abdominal pain and no shortness of breath.    History reviewed. No pertinent past medical history.  Patient Active Problem List   Diagnosis Date Noted  . Diverticulitis of colon with perforation 06/23/2015  . Obesity 06/22/2015    History reviewed. No pertinent surgical history.  OB History    Gravida Para Term Preterm AB Living   4 4 4     3    SAB TAB Ectopic Multiple Live Births                   Home Medications    Prior to Admission medications   Medication Sig Start Date End Date Taking? Authorizing Provider  ibuprofen (ADVIL,MOTRIN) 200 MG tablet Take 200 mg by mouth as needed for moderate pain.   Yes Historical Provider, MD  Naproxen Sodium (ALEVE) 220 MG CAPS Take 1 capsule by mouth as needed (Pain).   Yes Historical Provider, MD    Family History Family History  Problem Relation Age of Onset  . Diabetes Sister   . Cancer Brother     Social History Social History  Substance Use Topics  . Smoking status: Former Smoker    Packs/day: 0.50    Years:  15.00    Types: Cigarettes    Quit date: 10/04/2004  . Smokeless tobacco: Never Used  . Alcohol use No     Allergies   Patient has no known allergies.   Review of Systems Review of Systems  Constitutional: Negative for activity change.       All ROS Neg except as noted in HPI  HENT: Negative for nosebleeds.   Eyes: Negative for photophobia and discharge.  Respiratory: Positive for cough. Negative for shortness of breath and wheezing.   Cardiovascular: Negative for chest pain and palpitations.  Gastrointestinal: Negative for abdominal pain and blood in stool.  Genitourinary: Negative for dysuria, frequency and hematuria.  Musculoskeletal: Positive for arthralgias. Negative for back pain and neck pain.  Skin: Negative.   Neurological: Negative for dizziness, seizures and speech difficulty.  Psychiatric/Behavioral: Negative for confusion and hallucinations.     Physical Exam Updated Vital Signs BP 191/88 (BP Location: Left Arm)   Pulse 104   Temp 98.4 F (36.9 C) (Oral)   Resp 20   Ht 5\' 6"  (1.676 m)   Wt (!) 140.6 kg   SpO2 97%   BMI 50.04 kg/m   Physical Exam  Constitutional: She is oriented to person, place, and time. She appears well-developed  and well-nourished.  Non-toxic appearance.  HENT:  Head: Normocephalic.  Right Ear: Tympanic membrane and external ear normal.  Left Ear: Tympanic membrane and external ear normal.  Eyes: EOM and lids are normal. Pupils are equal, round, and reactive to light.  Neck: Normal range of motion. Neck supple. Carotid bruit is not present.  Cardiovascular: Normal rate, regular rhythm, normal heart sounds, intact distal pulses and normal pulses.   Pulmonary/Chest: Breath sounds normal. No respiratory distress.  Bilateral rhonchi. There is symmetrical rise and fall of the chest.  Abdominal: Soft. Bowel sounds are normal. There is no tenderness. There is no guarding.  Musculoskeletal: Normal range of motion.  There is some swelling  of the dorsum of the right foot. There no lesions noted in the web spaces between the toes. There is no puncture wound of the plantar surface. There is tenderness to palpation at the midfoot area. There is also mild tenderness to palpation at the plantar calcaneal surface. No hot areas appreciated. The Achilles tendon is intact. Dorsalis pedis pulses 2+.  Lymphadenopathy:       Head (right side): No submandibular adenopathy present.       Head (left side): No submandibular adenopathy present.    She has no cervical adenopathy.  Neurological: She is alert and oriented to person, place, and time. She has normal strength. No cranial nerve deficit or sensory deficit.  Skin: Skin is warm and dry.  Psychiatric: She has a normal mood and affect. Her speech is normal.  Nursing note and vitals reviewed.    ED Treatments / Results  Labs (all labs ordered are listed, but only abnormal results are displayed) Labs Reviewed - No data to display  EKG  EKG Interpretation None       Radiology Dg Foot Complete Right  Result Date: 10/14/2016 CLINICAL DATA:  Pain and bruising along the dorsal foot for several weeks. Pain in the first web space. EXAM: RIGHT FOOT COMPLETE - 3+ VIEW COMPARISON:  None. FINDINGS: No malalignment at the Lisfranc joint. The toes are flexed during imaging. No fracture or acute bony findings. Plantar and Achilles calcaneal spurs. Dorsal spurring in the midfoot. Mild spurring of the first digit sesamoids. On the lateral projection, there is soft tissue swelling along the dorsum of the foot and along the anterior ankle. Probably chronic fragmented osteophyte of the distal posterior tibial rim. IMPRESSION: 1. Dorsal subcutaneous edema along with some dorsal ankle swelling, cause uncertain. 2. Degenerative midfoot spurring. Plantar and Achilles calcaneal spurs. 3. If pain persists despite conservative therapy, MRI may be warranted for further characterization. Electronically Signed   By:  Gaylyn Rong M.D.   On: 10/14/2016 11:51    Procedures Procedures (including critical care time)  Medications Ordered in ED Medications - No data to display   Initial Impression / Assessment and Plan / ED Course  I have reviewed the triage vital signs and the nursing notes.  Pertinent labs & imaging results that were available during my care of the patient were reviewed by me and considered in my medical decision making (see chart for details).  Clinical Course     **I have reviewed nursing notes, vital signs, and all appropriate lab and imaging results for this patient.*  Final Clinical Impressions(s) / ED Diagnoses  MDM Vital signs within normal limits, with the exception of the pulse rate being elevated at 104, and the blood pressure being elevated at 191/88. There no hot areas involving the foot or ankle. There is  no history to report on broken skin areas or puncture wounds. Doubt hot or septic foot. Patient has a history of degenerative joint disease involving the ankle. X-ray of the right foot reveals multiple areas of degenerative joint disease with spurs. I suspect that this is the source of the pain and the swelling, complicated by standing for prolonged periods of time, as well as patient's obesity.  I've advised the patient to keep her feet elevated as much as possible. She will be treated with Decadron, diclofenac, and Ultram. The patient is referred to Dr. Romeo Apple for orthopedic evaluation of the foot. Patient is in agreement with this plan.    Final diagnoses:  DJD (degenerative joint disease), ankle and foot, right    New Prescriptions Discharge Medication List as of 10/14/2016  1:50 PM    START taking these medications   Details  dexamethasone (DECADRON) 4 MG tablet Take 1 tablet (4 mg total) by mouth 2 (two) times daily with a meal., Starting Thu 10/14/2016, Print    diclofenac (VOLTAREN) 75 MG EC tablet Take 1 tablet (75 mg total) by mouth 2 (two) times  daily., Starting Thu 10/14/2016, Print    traMADol (ULTRAM) 50 MG tablet Take 1 tablet (50 mg total) by mouth every 6 (six) hours as needed., Starting Thu 10/14/2016, Print         Ivery Quale, PA-C 10/14/16 1408    Bethann Berkshire, MD 10/15/16 [phone]

## 2016-10-14 NOTE — Discharge Instructions (Signed)
Your x-ray and your examination suggest arthritis involving your foot and ankle. It is important that you elevate your feet above your waist is much as possible. Please use Decadron and diclofenac 2 times daily with food. Use Ultram for pain if needed. This medication may cause drowsiness, please do not drive, drink alcohol, operate machinery, or participate in activities requiring concentration when taking this medication. Please see Dr. Romeo AppleHarrison for orthopedic evaluation concerning the arthritis in your foot.

## 2016-10-14 NOTE — ED Triage Notes (Signed)
Pt states her great grandson stepped on her right foot two weeks ago. Pt has had persistent pain and swelling in that food since then. Pt ambulatory on this foot. NAD noted.

## 2016-11-04 DEATH — deceased
